# Patient Record
Sex: Male | Born: 1990 | Race: White | Hispanic: No | Marital: Single | Smoking: Never smoker
Health system: Southern US, Community
[De-identification: ages and names within clinical notes are randomized; demographics above are authoritative.]

---
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
## 2010-08-25 ENCOUNTER — Emergency Department (HOSPITAL_COMMUNITY)
Admission: EM | Admit: 2010-08-25 | Discharge: 2010-08-25 | Disposition: A | Discharge status: Home / Self Care | Payer: Self-pay | Attending: Emergency Medicine | Admitting: Emergency Medicine

## 2010-08-25 ENCOUNTER — Emergency Department (HOSPITAL_COMMUNITY): Payer: Self-pay

## 2010-08-25 DIAGNOSIS — S93409A Sprain of unspecified ligament of unspecified ankle, initial encounter: Secondary | ICD-10-CM | POA: Insufficient documentation

## 2010-08-25 DIAGNOSIS — X500XXA Overexertion from strenuous movement or load, initial encounter: Secondary | ICD-10-CM | POA: Insufficient documentation

## 2010-08-25 DIAGNOSIS — M25579 Pain in unspecified ankle and joints of unspecified foot: Secondary | ICD-10-CM | POA: Insufficient documentation

## 2010-08-25 DIAGNOSIS — Y92009 Unspecified place in unspecified non-institutional (private) residence as the place of occurrence of the external cause: Secondary | ICD-10-CM | POA: Insufficient documentation

## 2011-07-16 IMAGING — CR DG ANKLE COMPLETE 3+V*L*
3 series · 3 of 3 positions shown · non-contrast
Comparison: Plain films 02/17/2004

CLINICAL DATA: Ankle pain, trauma

LEFT ANKLE COMPLETE - 3+ VIEW

[view not recorded (1 of 3)]
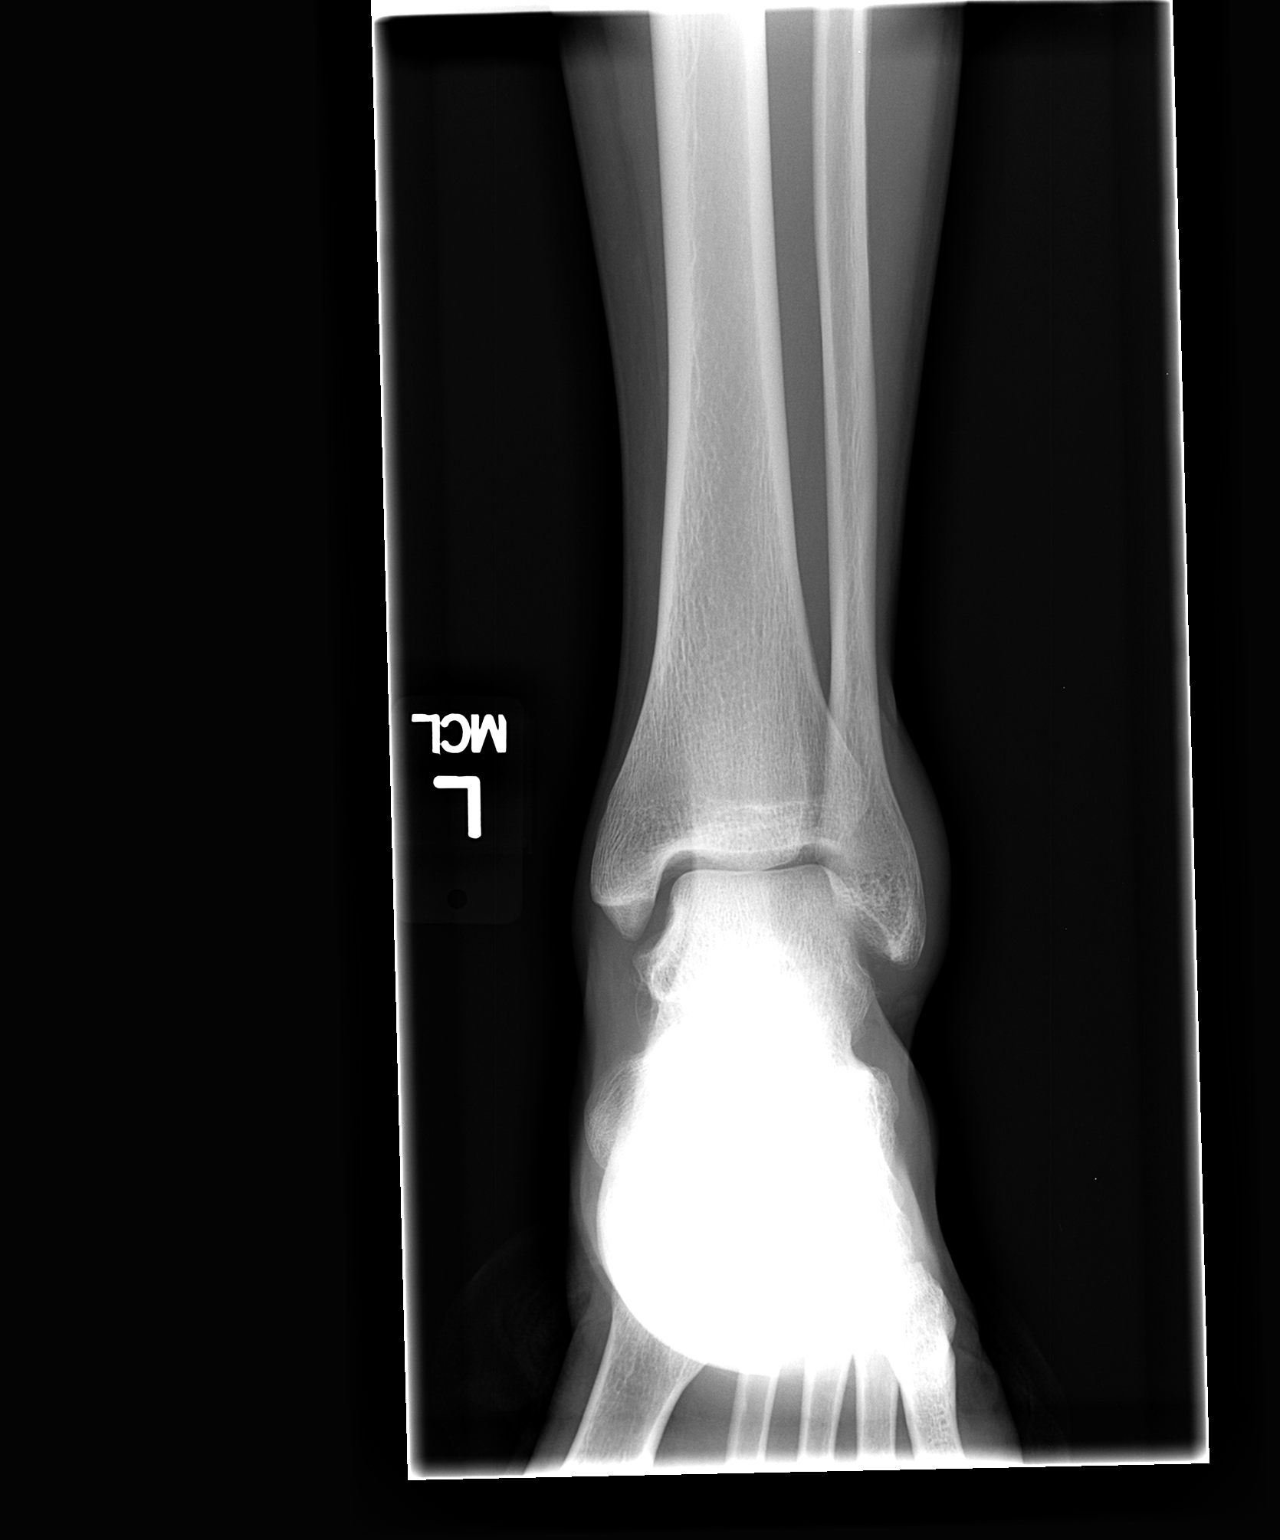

[view not recorded (2 of 3)]
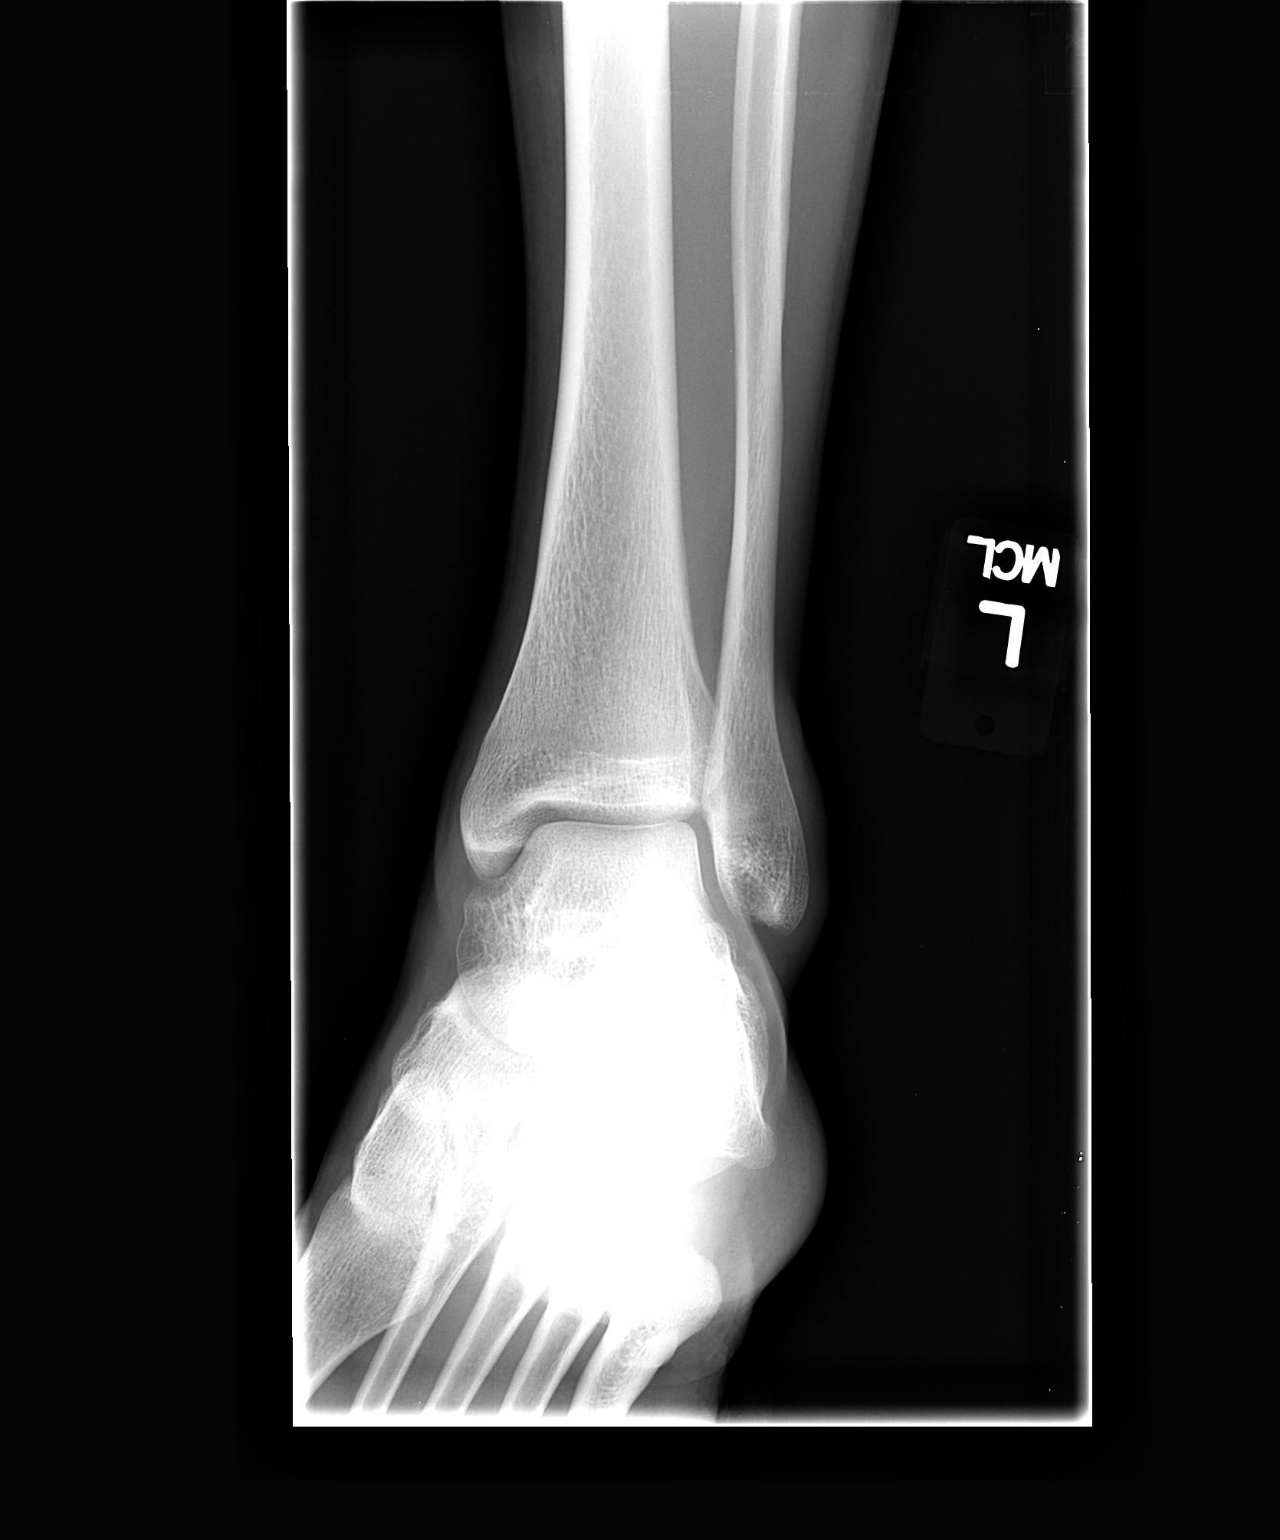

[view not recorded (3 of 3)]
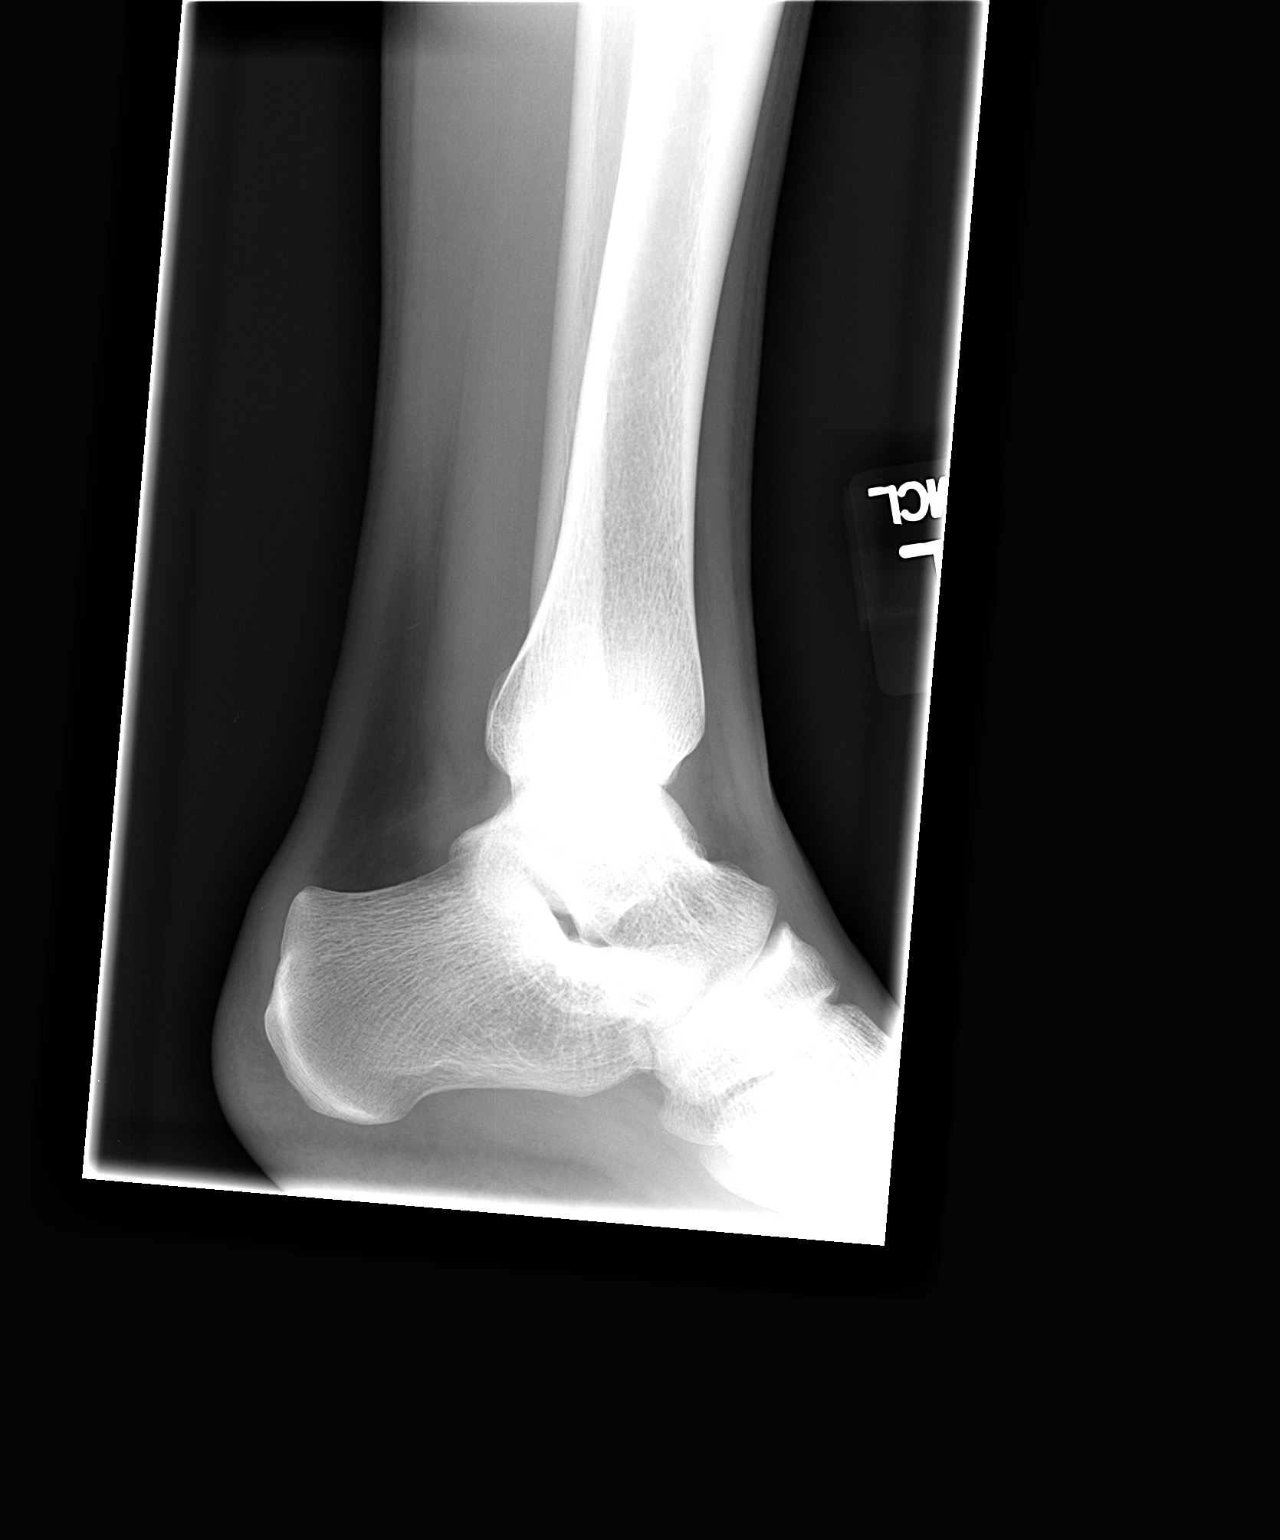

[3 of 3 positions shown; findings below may reference images not displayed]

FINDINGS: Ankle mortise intact.  Talar dome is normal.  No evidence
of malleolar fracture.  There is soft tissue swelling over the
lateral malleolus.  No joint effusion evident.
IMPRESSION: Soft tissue swelling over the lateral malleolus without evidence of
fracture.

## 2015-06-08 ENCOUNTER — Encounter (HOSPITAL_COMMUNITY): Payer: Self-pay | Admitting: Emergency Medicine

## 2015-06-08 ENCOUNTER — Emergency Department (HOSPITAL_COMMUNITY)
Admission: EM | Admit: 2015-06-08 | Discharge: 2015-06-09 | Disposition: A | Discharge status: Home / Self Care | Payer: Worker's Compensation | Attending: Emergency Medicine | Admitting: Emergency Medicine

## 2015-06-08 DIAGNOSIS — T7840XA Allergy, unspecified, initial encounter: Secondary | ICD-10-CM | POA: Diagnosis present

## 2015-06-08 DIAGNOSIS — R0602 Shortness of breath: Secondary | ICD-10-CM | POA: Diagnosis not present

## 2015-06-08 DIAGNOSIS — Y9389 Activity, other specified: Secondary | ICD-10-CM | POA: Diagnosis not present

## 2015-06-08 DIAGNOSIS — Y9289 Other specified places as the place of occurrence of the external cause: Secondary | ICD-10-CM | POA: Diagnosis not present

## 2015-06-08 DIAGNOSIS — L509 Urticaria, unspecified: Secondary | ICD-10-CM | POA: Insufficient documentation

## 2015-06-08 DIAGNOSIS — R062 Wheezing: Secondary | ICD-10-CM | POA: Insufficient documentation

## 2015-06-08 DIAGNOSIS — L299 Pruritus, unspecified: Secondary | ICD-10-CM | POA: Insufficient documentation

## 2015-06-08 DIAGNOSIS — X58XXXA Exposure to other specified factors, initial encounter: Secondary | ICD-10-CM | POA: Diagnosis not present

## 2015-06-08 DIAGNOSIS — Y998 Other external cause status: Secondary | ICD-10-CM | POA: Insufficient documentation

## 2015-06-08 MED ORDER — RANITIDINE HCL 150 MG PO TABS: 150.0000 mg | ORAL_TABLET | Freq: Two times a day (BID) | ORAL | Status: AC

## 2015-06-08 MED ORDER — EPINEPHRINE 0.3 MG/0.3ML IJ SOAJ: 0.3000 mg | Freq: Once | INTRAMUSCULAR | Status: AC

## 2015-06-08 MED ORDER — DIPHENHYDRAMINE HCL 25 MG PO TABS: 50.0000 mg | ORAL_TABLET | Freq: Four times a day (QID) | ORAL | Status: AC | PRN

## 2015-06-08 MED ORDER — PREDNISONE 20 MG PO TABS: ORAL_TABLET | ORAL | Status: AC

## 2015-06-08 NOTE — ED Provider Notes (Signed)
CSN: [id number]     Arrival date & time 06/08/15  2141 History   First MD Initiated Contact with Patient 06/08/15 2148     Chief Complaint  Patient presents with  . Allergic Reaction     (Consider location/radiation/quality/duration/timing/severity/associated sxs/prior Treatment) HPI Comments: Jeffery Molina is a 24 y.o. Male who presents to the ED with complaints of allergic reaction to an unknown allergen. Approximately 45 minutes prior to arrival patient states that he just finished eating pizza from a location that he is eating from before, and a meal that he has eaten in the past, and approximately 20 minutes after eating his meal he developed generalized pruritus, hives, which quickly developed into shortness of breath and wheezing. He quickly took 50 mg PO Benadryl and 150 mg PO Zantac, and when EMS arrived they gave him 125 mg IV Solu-Medrol and 5 mg of albuterol, which all caused complete resolution of his symptoms. He states the only residual symptom is some itching near his feet, but the hives have nearly dissipated entirely, and he feels no ongoing shortness of breath or wheezing. He has never had an allergic reaction before. He denies any recent changes in soaps, detergents, and more plant contact, medication changes, perfumes or chemical exposures, or new foods. Denies any known insect bites.  He denies any recent fevers or chills, drooling, sore throat, ear pain or drainage, cough, rhinorrhea, tongue or lip swelling, chest pain, ongoing shortness of breath or wheezing, abdominal pain, nausea, vomiting, diarrhea, constipation, dysuria, hematuria, numbness, tingling, weakness, or syncope. He feels much better at this time.  Patient is a 24 y.o. male presenting with allergic reaction. The history is provided by the patient and the EMS personnel. No language interpreter was used.  Allergic Reaction Presenting symptoms: difficulty breathing, itching, rash (generalized urticaria and  pruritis) and wheezing (now resolved)   Presenting symptoms: no difficulty swallowing and no swelling   Severity:  Moderate Prior allergic episodes:  No prior episodes Context: food (ate pizza prior to onset, has eaten same meal before with no reaction)   Context: no animal exposure, no chemicals, no grass, no insect bite/sting, no medications and no new detergents/soaps   Relieved by:  Bronchodilators, antihistamines and steroids Worsened by:  Nothing tried Ineffective treatments:  None tried   History reviewed. No pertinent past medical history. History reviewed. No pertinent past surgical history. History reviewed. No pertinent family history. Social History  Substance Use Topics  . Smoking status: Never Smoker   . Smokeless tobacco: None  . Alcohol Use: Yes     Comment: occ    Review of Systems  Constitutional: Negative for fever and chills.  HENT: Negative for drooling, ear discharge, ear pain, facial swelling, rhinorrhea, sore throat and trouble swallowing.   Eyes: Negative for itching.  Respiratory: Positive for shortness of breath (now resolved) and wheezing (now resolved). Negative for cough.   Cardiovascular: Negative for chest pain.  Gastrointestinal: Negative for nausea, vomiting, abdominal pain, diarrhea and constipation.  Genitourinary: Negative for dysuria and hematuria.  Musculoskeletal: Negative for myalgias and arthralgias.  Skin: Positive for itching and rash (generalized urticaria and pruritis).  Allergic/Immunologic: Negative for immunocompromised state.  Neurological: Negative for weakness, light-headedness and numbness.  Psychiatric/Behavioral: Negative for confusion.   10 Systems reviewed and are negative for acute change except as noted in the HPI.    Allergies  Review of patient's allergies indicates no known allergies.  Home Medications   Prior to Admission medications   Not  on File   BP 134/72 mmHg  Pulse 80  Temp(Src) 98 F (36.7 C)  (Oral)  Resp 21  Ht 5\' 9"  (1.753 m)  Wt 83.008 kg  BMI 27.01 kg/m2  SpO2 97% Physical Exam  Constitutional: He is oriented to person, place, and time. Vital signs are normal. He appears well-developed and well-nourished.  Non-toxic appearance. No distress.  Afebrile, nontoxic, NAD  HENT:  Head: Normocephalic and atraumatic.  Mouth/Throat: Uvula is midline, oropharynx is clear and moist and mucous membranes are normal. No trismus in the jaw. No uvula swelling.  No tongue/lip swelling, airway patent. Throat clear.   Eyes: Conjunctivae and EOM are normal. Right eye exhibits no discharge. Left eye exhibits no discharge.  Neck: Normal range of motion and phonation normal. Neck supple.  No stridor or abnormal phonation  Cardiovascular: Normal rate, regular rhythm, normal heart sounds and intact distal pulses.  Exam reveals no gallop and no friction rub.   No murmur heard. Pulmonary/Chest: Effort normal and breath sounds normal. No respiratory distress. He has no decreased breath sounds. He has no wheezes. He has no rhonchi. He has no rales.  CTAB in all lung fields, no w/r/r, no hypoxia or increased WOB, speaking in full sentences, SpO2 97% on RA   Abdominal: Soft. Normal appearance and bowel sounds are normal. He exhibits no distension. There is no tenderness. There is no rigidity, no rebound, no guarding and no CVA tenderness.  Musculoskeletal: Normal range of motion.  Neurological: He is alert and oriented to person, place, and time. He has normal strength. No sensory deficit.  Skin: Skin is warm, dry and intact. Rash noted. Rash is urticarial.  Resolving urticarial rash noted to b/l forearms, nearly completely resolved, no other urticaria noted. No erythema or drainage.  Psychiatric: He has a normal mood and affect.  Nursing note and vitals reviewed.   ED Course  Procedures (including critical care time) Labs Review Labs Reviewed - No data to display  Imaging Review No results  found. I have personally reviewed and evaluated these images and lab results as part of my medical decision-making.   EKG Interpretation None      MDM   Final diagnoses:  Allergic reaction, initial encounter  Urticaria  Itching  SOB (shortness of breath)  Wheezing    24 y.o. male here with allergic reaction to unknown allergen. Patient states he just eating a pizza at a location that he has eaten from before, and ate the exact same pizza he had several days ago, and approximately 20 minutes later developed generalized pruritus and hives, shortness breath, and wheezing. He was given 50 mg of Benadryl, 150 mg Zantac, and 125 mg IV Solu-Medrol as well as a 5 mg albuterol dose with complete resolution of symptoms. He has some pruritus but the hives have resolved. No longer feels short of breath or has any wheezing. No tongue/lip swelling, airway patent. Given unknown nature of allergen, and severity of his reaction, would like to monitor for 3-4hrs post treatment to ensure no worsening or need for epinephrine. Will monitor closely.  12:51 AM No worsening or recurring symptoms, hives resolved, symptoms gone. I feel safe that pt can be d/c'd home. Will have him do prednisone and zantac/benadryl x3 days, and will give epipen rx so he has it if this recurs. Discussed close f/up with PCP and possible allergy testing. I explained the diagnosis and have given explicit precautions to return to the ER including for any other new or  worsening symptoms. The patient understands and accepts the medical plan as it's been dictated and I have answered their questions. Discharge instructions concerning home care and prescriptions have been given. The patient is STABLE and is discharged to home in good condition.  BP 121/70 mmHg  Pulse 81  Temp(Src) 98 F (36.7 C) (Oral)  Resp 21  Ht  (1.753 m)  Wt 83.008 kg  BMI 27.01 kg/m2  SpO2 98%  Meds ordered this encounter  Medications  . predniSONE  (DELTASONE) 20 MG tablet    Sig: 3 tabs po daily x 3 days    Dispense:  9 tablet    Refill:  0    Order Specific Question:  Supervising Provider    Answer:  MILLER, BRIAN [[id number]]  . diphenhydrAMINE (BENADRYL) 25 MG tablet    Sig: Take 2 tablets (50 mg total) by mouth every 6 (six) hours as needed for itching.    Dispense:  20 tablet    Refill:  0    Order Specific Question:  Supervising Provider    Answer:  MILLER, BRIAN [[id number]]  . ranitidine (ZANTAC) 150 MG tablet    Sig: Take 1 tablet (150 mg total) by mouth 2 (two) times daily. X 3 days    Dispense:  6 tablet    Refill:  0    Order Specific Question:  Supervising Provider    Answer:  MILLER, BRIAN [[id number]]  . EPINEPHrine 0.3 mg/0.3 mL IJ SOAJ injection    Sig: Inject 0.3 mLs (0.3 mg total) into the muscle once.    Dispense:  1 Device    Refill:  2    Order Specific Question:  Supervising Provider    Answer:  Eber Hong [[id number]]      Cuca Benassi Camprubi-Soms, PA-C 06/09/15 0055  Melene Plan, DO 06/09/15 1345

## 2015-06-08 NOTE — ED Notes (Signed)
PA at bedside.

## 2015-06-08 NOTE — ED Notes (Signed)
[facility] after having an allergic reaction while at work. Patient initially c/o SOB, wheezing, pruritus, and hives all over body. Patient denies coming into contact with anything new prior to reaction. EMS gave 50mg  PO Benadryl, 50mg  Zantac, 125mg  Solumedrol, and 5mg  Albuterol; has 20g. RAC. Patient denies allergies to any foods/medications/lotions. Patient A&O x 4, respirations e/u, NAD at this time.

## 2015-06-09 NOTE — ED Notes (Signed)
Patient verbalized understanding of discharge instructions and denies any further needs or questions at this time. VS stable. Patient ambulatory with steady gait.  

## 2015-06-09 NOTE — Discharge Instructions (Signed)
Take Prednisone, Benadryl, and zantac as prescribed to help with your allergic reaction. Fill the prescription for an Epipen in order to have this on hand in case another severe allergic reaction occurs. Continue your usual home medications. Get plenty of rest and drink plenty of fluids. Avoid any known triggers. Please followup with your primary doctor for discussion of your diagnoses and further evaluation after today's visit. Return to the ER for changes or worsening symptoms.    Hives Hives are itchy, red, puffy (swollen) areas of the skin. Hives can change in size and location on your body. Hives can come and go for hours, days, or weeks. Hives do not spread from person to person (noncontagious). Scratching, exercise, and stress can make your hives worse. HOME CARE  Avoid things that cause your hives (triggers).  Take antihistamine medicines as told by your doctor. Do not drive while taking an antihistamine.  Take any other medicines for itching as told by your doctor.  Wear loose-fitting clothing.  Keep all doctor visits as told. GET HELP RIGHT AWAY IF:   You have a fever.  Your tongue or lips are puffy.  You have trouble breathing or swallowing.  You feel tightness in the throat or chest.  You have belly (abdominal) pain.  You have lasting or severe itching that is not helped by medicine.  You have painful or puffy joints. These problems may be the first sign of a life-threatening allergic reaction. Call your local emergency services (911 in [place] MAKE SURE YOU:   Understand these instructions.  Will watch your condition.  Will get help right away if you are not doing well or get worse.   This information is not intended to replace advice given to you by your health care provider. Make sure you discuss any questions you have with your health care provider.   Document Released: 03/28/2008 Document Revised: 12/19/2011 Document Reviewed: 09/12/2011 Elsevier Interactive  Patient Education [employer]  Allergies An allergy is when your body reacts to a substance in a way that is not normal. An allergic reaction can happen after you:  Eat something.  Breathe in something.  Touch something. WHAT KINDS OF ALLERGIES ARE THERE? You can be allergic to:  Things that are only around during certain seasons, like molds and pollens.  Foods.  Drugs.  Insects.  Animal dander. WHAT ARE SYMPTOMS OF ALLERGIES?  Puffiness (swelling). This may happen on the lips, face, tongue, mouth, or throat.  Sneezing.  Coughing.  Breathing loudly (wheezing).  Stuffy nose.  Tingling in the mouth.  A rash.  Itching.  Itchy, red, puffy areas of skin (hives).  Watery eyes.  Throwing up (vomiting).  Watery poop (diarrhea).  Dizziness.  Feeling faint or fainting.  Trouble breathing or swallowing.  A tight feeling in the chest.  A fast heartbeat. HOW ARE ALLERGIES DIAGNOSED? Allergies can be diagnosed with:  A medical and family history.  Skin tests.  Blood tests.  A food diary. A food diary is a record of all the foods, drinks, and symptoms you have each day.  The results of an elimination diet. This diet involves making sure not to eat certain foods and then seeing what happens when you start eating them again. HOW ARE ALLERGIES TREATED? There is no cure for allergies, but allergic reactions can be treated with medicine. Severe reactions usually need to be treated at a hospital.  HOW CAN REACTIONS BE PREVENTED? The best way to prevent an allergic reaction is  to avoid the thing you are allergic to. Allergy shots and medicines can also help prevent reactions in some cases.   This information is not intended to replace advice given to you by your health care provider. Make sure you discuss any questions you have with your health care provider.   Document Released: 10/14/2012 Document Revised: 07/10/2014 Document Reviewed:  03/31/2014 Elsevier Interactive Patient Education [employer] Inc[employer]

## 2018-11-23 ENCOUNTER — Telehealth: Payer: Self-pay | Admitting: Physician Assistant

## 2018-11-23 DIAGNOSIS — R29898 Other symptoms and signs involving the musculoskeletal system: Secondary | ICD-10-CM

## 2018-11-23 DIAGNOSIS — M5410 Radiculopathy, site unspecified: Secondary | ICD-10-CM

## 2018-11-23 NOTE — Progress Notes (Signed)
Based on what you shared with me, I feel your condition warrants further evaluation and I recommend that you be seen for a face to face office visit.  Giving the mention of weakness or numbness in an extremity along with the back pain, you will require a in-person visit for detailed examination to make sure there are not signs of nerve compression in the lower back and so the most appropriate treatment can be given.    NOTE: If you entered your credit card information for this eVisit, you will not be charged. You may see a "hold" on your card for the $35 but that hold will drop off and you will not have a charge processed.  If you are having a true medical emergency please call 911.  If you need an urgent face to face visit, [place] has four urgent care centers for your convenience.    PLEASE NOTE: THE [facility] LOCATIONS AND URGENT CARE CLINICS DO NOT HAVE THE TESTING FOR CORONAVIRUS COVID19 AVAILABLE.  IF YOU FEEL YOU NEED THIS TEST YOU MUST GO TO A TRIAGE LOCATION AT ONE OF THE HOSPITAL EMERGENCY DEPARTMENTS   [email or web address] to reserve your spot online an avoid wait times  [facility] [address], [address] [place], [place] [address] Modified hours of operation: Monday-Friday, 12 PM to 6 PM  Saturday & Sunday 10 AM to 4 PM *Across the street from [employer]  [employer] [employer] (New Address!) [address], [address] [place], [place] [address] *Just off [employer], across the road from [place]* Modified hours of operation: Monday-Friday, 12 PM to 6 PM  Closed Saturday & Sunday  [facility]'s modified hours of operation will be in effect from May 1 until May 31   The following sites will take your insurance:  . [facility]  [phone] Get Driving Directions Find a Provider at this Location  [address] [place], [place] [address] . 10 am to 8 pm Monday-Friday . 12 pm to 8 pm Saturday-Sunday   .  [facility] at [facility]  [phone] Get Driving Directions Find a Provider at this Location  [address] [place], [place] [address] . 8 am to 8 pm Monday-Friday . 9 am to 6 pm Saturday . 11 am to 6 pm Sunday   . [facility] at [facility]  [phone] Get Driving Directions  [address] [place], [place] [address] . 8 am to 8 pm Monday-Friday . 8 am to 4 pm Saturday-Sunday   Your e-visit answers were reviewed by a board certified advanced clinical practitioner to complete your personal care plan.  Thank you for using e-Visits.

## 2021-07-03 DEATH — deceased
